# Patient Record
Sex: Female | Born: 1935 | Race: Black or African American | Hispanic: No | Marital: Married | Smoking: Former smoker
Health system: Southern US, Community
[De-identification: ages and names within clinical notes are randomized; demographics above are authoritative.]

## PROBLEM LIST (undated history)

## (undated) DIAGNOSIS — J849 Interstitial pulmonary disease, unspecified: Secondary | ICD-10-CM

## (undated) DIAGNOSIS — K219 Gastro-esophageal reflux disease without esophagitis: Secondary | ICD-10-CM

---

## 2021-01-23 ENCOUNTER — Other Ambulatory Visit: Payer: Self-pay

## 2021-01-23 ENCOUNTER — Emergency Department (HOSPITAL_COMMUNITY)
Admission: EM | Admit: 2021-01-23 | Discharge: 2021-01-23 | Disposition: A | Discharge status: Home / Self Care | Payer: Medicare Other | Attending: Emergency Medicine | Admitting: Emergency Medicine

## 2021-01-23 ENCOUNTER — Emergency Department (HOSPITAL_COMMUNITY): Payer: Medicare Other

## 2021-01-23 ENCOUNTER — Encounter (HOSPITAL_COMMUNITY): Payer: Self-pay | Admitting: *Deleted

## 2021-01-23 DIAGNOSIS — Z20822 Contact with and (suspected) exposure to covid-19: Secondary | ICD-10-CM | POA: Insufficient documentation

## 2021-01-23 DIAGNOSIS — R0602 Shortness of breath: Secondary | ICD-10-CM | POA: Diagnosis present

## 2021-01-23 DIAGNOSIS — Z7982 Long term (current) use of aspirin: Secondary | ICD-10-CM | POA: Insufficient documentation

## 2021-01-23 DIAGNOSIS — J8410 Pulmonary fibrosis, unspecified: Secondary | ICD-10-CM | POA: Diagnosis not present

## 2021-01-23 DIAGNOSIS — Z87891 Personal history of nicotine dependence: Secondary | ICD-10-CM | POA: Insufficient documentation

## 2021-01-23 HISTORY — DX: Gastro-esophageal reflux disease without esophagitis: K21.9

## 2021-01-23 HISTORY — DX: Interstitial pulmonary disease, unspecified: J84.9

## 2021-01-23 LAB — COMPREHENSIVE METABOLIC PANEL
ALT: 13 U/L (ref 0–44)
AST: 21 U/L (ref 15–41)
Albumin: 3.5 g/dL (ref 3.5–5.0)
Alkaline Phosphatase: 57 U/L (ref 38–126)
Anion gap: 7 (ref 5–15)
BUN: 14 mg/dL (ref 8–23)
CO2: 37 mmol/L — ABNORMAL HIGH (ref 22–32)
Calcium: 9 mg/dL (ref 8.9–10.3)
Chloride: 84 mmol/L — ABNORMAL LOW (ref 98–111)
Creatinine, Ser: 0.73 mg/dL (ref 0.44–1.00)
GFR, Estimated: >60 mL/min (ref >60)
Glucose, Bld: 106 mg/dL — ABNORMAL HIGH (ref 70–99)
Potassium: 4.3 mmol/L (ref 3.5–5.1)
Sodium: 128 mmol/L — ABNORMAL LOW (ref 135–145)
Total Bilirubin: 0.5 mg/dL (ref 0.3–1.2)
Total Protein: 7.6 g/dL (ref 6.5–8.1)

## 2021-01-23 LAB — CBC WITH DIFFERENTIAL/PLATELET
Abs Immature Granulocytes: 0.02 10*3/uL (ref 0.00–0.07)
Basophils Absolute: 0 10*3/uL (ref 0.0–0.1)
Basophils Relative: 0 %
Eosinophils Absolute: 0.1 10*3/uL (ref 0.0–0.5)
Eosinophils Relative: 1 %
HCT: 43.8 % (ref 36.0–46.0)
Hemoglobin: 14.2 g/dL (ref 12.0–15.0)
Immature Granulocytes: 0 %
Lymphocytes Relative: 16 %
Lymphs Abs: 1 10*3/uL (ref 0.7–4.0)
MCH: 30.7 pg (ref 26.0–34.0)
MCHC: 32.4 g/dL (ref 30.0–36.0)
MCV: 94.8 fL (ref 80.0–100.0)
Monocytes Absolute: 0.4 10*3/uL (ref 0.1–1.0)
Monocytes Relative: 6 %
Neutro Abs: 4.7 10*3/uL (ref 1.7–7.7)
Neutrophils Relative %: 77 %
Platelets: 157 10*3/uL (ref 150–400)
RBC: 4.62 MIL/uL (ref 3.87–5.11)
RDW: 12.4 % (ref 11.5–15.5)
WBC: 6.1 10*3/uL (ref 4.0–10.5)
nRBC: 0 % (ref 0.0–0.2)

## 2021-01-23 LAB — URINALYSIS, ROUTINE W REFLEX MICROSCOPIC
Bacteria, UA: NONE SEEN
Bilirubin Urine: NEGATIVE
Glucose, UA: NEGATIVE mg/dL
Ketones, ur: NEGATIVE mg/dL
Leukocytes,Ua: NEGATIVE
Nitrite: NEGATIVE
Protein, ur: 30 mg/dL — AB
RBC / HPF: >50 RBC/hpf — ABNORMAL HIGH (ref 0–5)
Specific Gravity, Urine: 1.013 (ref 1.005–1.030)
pH: 6 (ref 5.0–8.0)

## 2021-01-23 LAB — RESP PANEL BY RT-PCR (FLU A&B, COVID) ARPGX2
Influenza A by PCR: NEGATIVE
Influenza B by PCR: NEGATIVE
SARS Coronavirus 2 by RT PCR: NEGATIVE

## 2021-01-23 LAB — BRAIN NATRIURETIC PEPTIDE: B Natriuretic Peptide: 423 pg/mL — ABNORMAL HIGH (ref 0.0–100.0)

## 2021-01-23 MED ORDER — METHYLPREDNISOLONE SODIUM SUCC 125 MG IJ SOLR
125.0000 mg | Freq: Once | INTRAMUSCULAR | Status: AC
  Administered 2021-01-23: 125 mg via INTRAVENOUS
  Filled 2021-01-23: qty 2

## 2021-01-23 MED ORDER — PREDNISONE 10 MG (21) PO TBPK: ORAL_TABLET | Freq: Every day | ORAL | 0 refills | Status: AC

## 2021-01-23 NOTE — Discharge Instructions (Addendum)
Do not take the prednisone 10 mg while taking the prednisone taper.  You can resume taking the 10 mg once you are done with your taper.

## 2021-01-23 NOTE — ED Notes (Signed)
This nurse attempted to ambulate patient, family at bedside stated that she is unable to walk.

## 2021-01-23 NOTE — ED Triage Notes (Signed)
C/o shortness of breath, sent by home health nurse

## 2021-01-23 NOTE — ED Provider Notes (Signed)
[facility] EMERGENCY DEPARTMENT Provider Note   CSN: [id number] Arrival date & time: 01/23/21  1719     History Chief Complaint  Patient presents with   Shortness of Breath    Denise Bates is a 86 y.o. female.  Pt presents to the ED today with sob.  Pt has a hx of pulmonary hypertension and pulmonary fibrosis.  She is on 4 L oxygen all the time.  She has a home health nurse that comes out and said her O2 sat would drop into the mid-80s when she moved around.  She also feels like her stomach is "raw."  No fevers.  She has been vaccinated against Covid, but no booster.      Past Medical History:  Diagnosis Date   Acid reflux disease    Interstitial lung disease (HCC)     There are no problems to display for this patient.   History reviewed. No pertinent surgical history.   OB History   No obstetric history on file.     No family history on file.  Social History   Tobacco Use   Smoking status: Former    Types: Cigarettes   Smokeless tobacco: Never  Substance Use Topics   Alcohol use: Not Currently    Home Medications Prior to Admission medications   Medication Sig Start Date End Date Taking? Authorizing Provider  aspirin EC 81 MG tablet Take 81 mg by mouth once a week. Swallow whole.   Yes [provider]  carvedilol (COREG) 6.25 MG tablet Take 6.25 mg by mouth 2 (two) times daily. 01/03/21  Yes [provider]  esomeprazole (NEXIUM) 40 MG capsule Take 40 mg by mouth daily. 01/03/21  Yes [provider]  famotidine (PEPCID) 20 MG tablet Take 20 mg by mouth 2 (two) times daily. 10/21/20  Yes [provider]  predniSONE (DELTASONE) 10 MG tablet Take 10 mg by mouth daily with breakfast.   Yes [provider]  predniSONE (STERAPRED UNI-PAK 21 TAB) 10 MG (21) TBPK tablet Take by mouth daily. Take 6 tabs by mouth daily  for 2 days, then 5 tabs for 2 days, then 4 tabs for 2 days, then 3 tabs for 2 days, 2 tabs for 2 days,  then 1 tab by mouth daily for 2 days 01/23/21  Yes Jacalyn Lefevre, MD  vitamin B-12 (CYANOCOBALAMIN) 500 MCG tablet Take 500 mcg by mouth daily.   Yes [provider]    Allergies    Patient has no known allergies.  Review of Systems   Review of Systems  Respiratory:  Positive for shortness of breath.   Gastrointestinal:  Positive for nausea.  All other systems reviewed and are negative.  Physical Exam Updated Vital Signs BP 119/66   Pulse 92   Temp 98.7 F (37.1 C) (Oral)   Resp (!) 35   SpO2 93%   Physical Exam Vitals and nursing note reviewed.  Constitutional:      Appearance: She is well-developed.  HENT:     Head: Normocephalic and atraumatic.     Mouth/Throat:     Mouth: Mucous membranes are moist.     Pharynx: Oropharynx is clear.  Eyes:     Extraocular Movements: Extraocular movements intact.     Pupils: Pupils are equal, round, and reactive to light.  Cardiovascular:     Rate and Rhythm: Normal rate and regular rhythm.  Pulmonary:     Effort: Tachypnea present.     Breath sounds: Rhonchi present.  Abdominal:     General: Bowel sounds are normal.     Palpations: Abdomen is soft.  Musculoskeletal:        General: Normal range of motion.     Cervical back: Normal range of motion and neck supple.  Skin:    General: Skin is warm.     Capillary Refill: Capillary refill takes less than 2 seconds.  Neurological:     General: No focal deficit present.     Mental Status: She is alert and oriented to person, place, and time.  Psychiatric:        Mood and Affect: Mood normal.        Behavior: Behavior normal.    ED Results / Procedures / Treatments   Labs (all labs ordered are listed, but only abnormal results are displayed) Labs Reviewed  BRAIN NATRIURETIC PEPTIDE - Abnormal; Notable for the following components:      Result Value   B Natriuretic Peptide 423.0 (*)    All other components within normal limits  COMPREHENSIVE METABOLIC PANEL -  Abnormal; Notable for the following components:   Sodium 128 (*)    Chloride 84 (*)    CO2 37 (*)    Glucose, Bld 106 (*)    All other components within normal limits  URINALYSIS, ROUTINE W REFLEX MICROSCOPIC - Abnormal; Notable for the following components:   Color, Urine AMBER (*)    Hgb urine dipstick LARGE (*)    Protein, ur 30 (*)    RBC / HPF >50 (*)    All other components within normal limits  RESP PANEL BY RT-PCR (FLU A&B, COVID) ARPGX2  CBC WITH DIFFERENTIAL/PLATELET    EKG EKG Interpretation  Date/Time:  Thursday January 23 2021 19:00:43 EDT Ventricular Rate:  89 PR Interval:  146 QRS Duration: 100 QT Interval:  377 QTC Calculation: 459 R Axis:   1 Text Interpretation: Sinus arrhythmia Probable left atrial enlargement Borderline T abnormalities, inferior leads No old tracing to compare Confirmed by Jacalyn Lefevre [phone]) on 01/23/2021 7:05:57 PM  Radiology DG Chest 2 View  Result Date: 01/23/2021 CLINICAL DATA:  Short of breath, history of interstitial lung disease EXAM: CHEST - 2 VIEW COMPARISON:  None. FINDINGS: Frontal and lateral views of the chest demonstrate an unremarkable cardiac silhouette. There is extensive parenchymal lung scarring and fibrosis. No superimposed airspace disease, effusion, or pneumothorax. No acute bony abnormalities. IMPRESSION: 1. Extensive pulmonary fibrosis.  No acute airspace disease. Electronically Signed   By: Sharlet Salina M.D.   On: 01/23/2021 19:12    Procedures Procedures   Medications Ordered in ED Medications  methylPREDNISolone sodium succinate (SOLU-MEDROL) 125 mg/2 mL injection 125 mg (125 mg Intravenous Given 01/23/21 2203)    ED Course  I have reviewed the triage vital signs and the nursing notes.  Pertinent labs & imaging results that were available during my care of the patient were reviewed by me and considered in my medical decision making (see chart for details).    MDM Rules/Calculators/A&P                            Pt has severe pulmonary fibrosis, but pt and husband seem to understand very little about her disease.  She has seen a pulmonologist for years and it looks like pt and family have been told numerous times the severity of her disease.  Pt's husband  shown pt's CXR and I compared it to a normal  lung CXR.  I tried to explain the end stage nature of the disease.  Pt was getting it, but the husband was not.  Covid negative.  Pt's O2 sat does drop with movement.  II think this is likely chronic for her.  Pt is nonambulatory at home.  She is given a dose of solumedrol here and is d/c with a prednisone taper.  She is stable for d/c.   Final Clinical Impression(s) / ED Diagnoses Final diagnoses:  Pulmonary fibrosis (HCC)    Rx / DC Orders ED Discharge Orders          Ordered    predniSONE (STERAPRED UNI-PAK 21 TAB) 10 MG (21) TBPK tablet  Daily        01/23/21 2214             Jacalyn Lefevre, MD 01/23/21 2216

## 2021-10-04 DEATH — deceased

## 2022-01-01 IMAGING — DX DG CHEST 2V
2 series · 2 of 2 positions shown · non-contrast
Comparison: None.

CLINICAL DATA: Short of breath, history of interstitial lung
disease

EXAM:
CHEST - 2 VIEW

[chest ap]
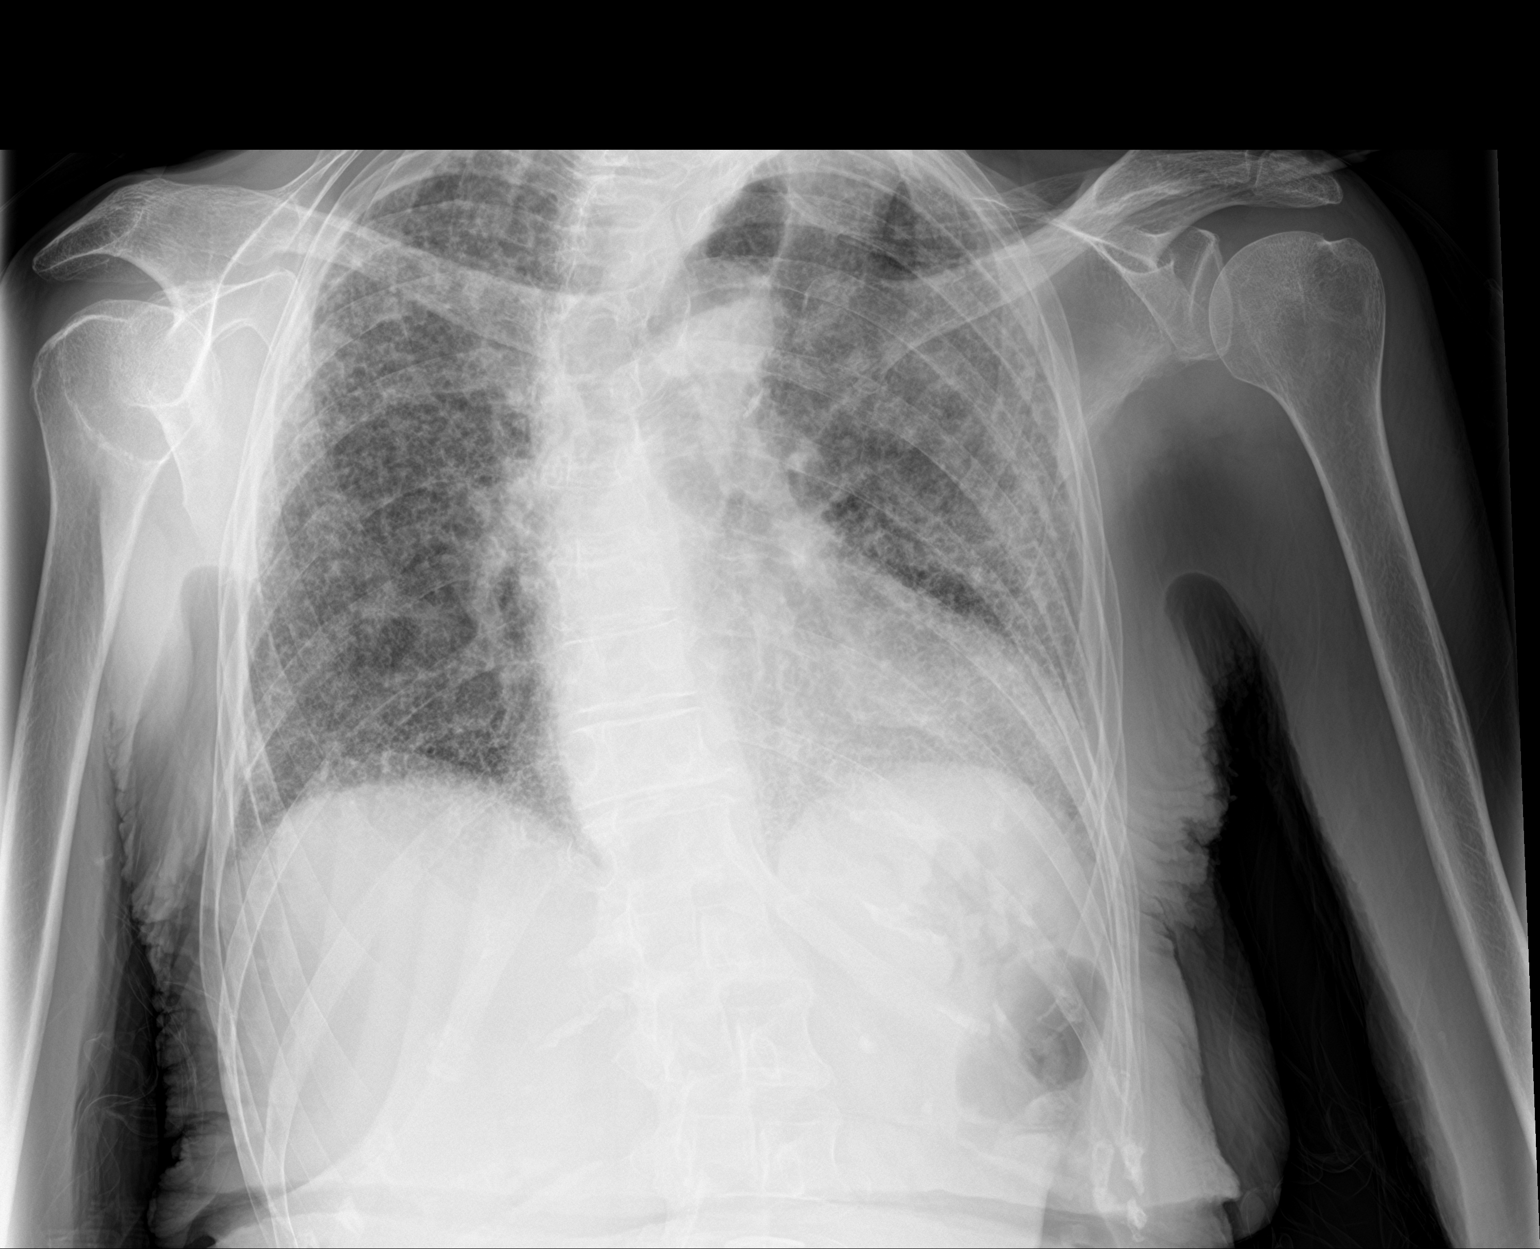

[chest lat]
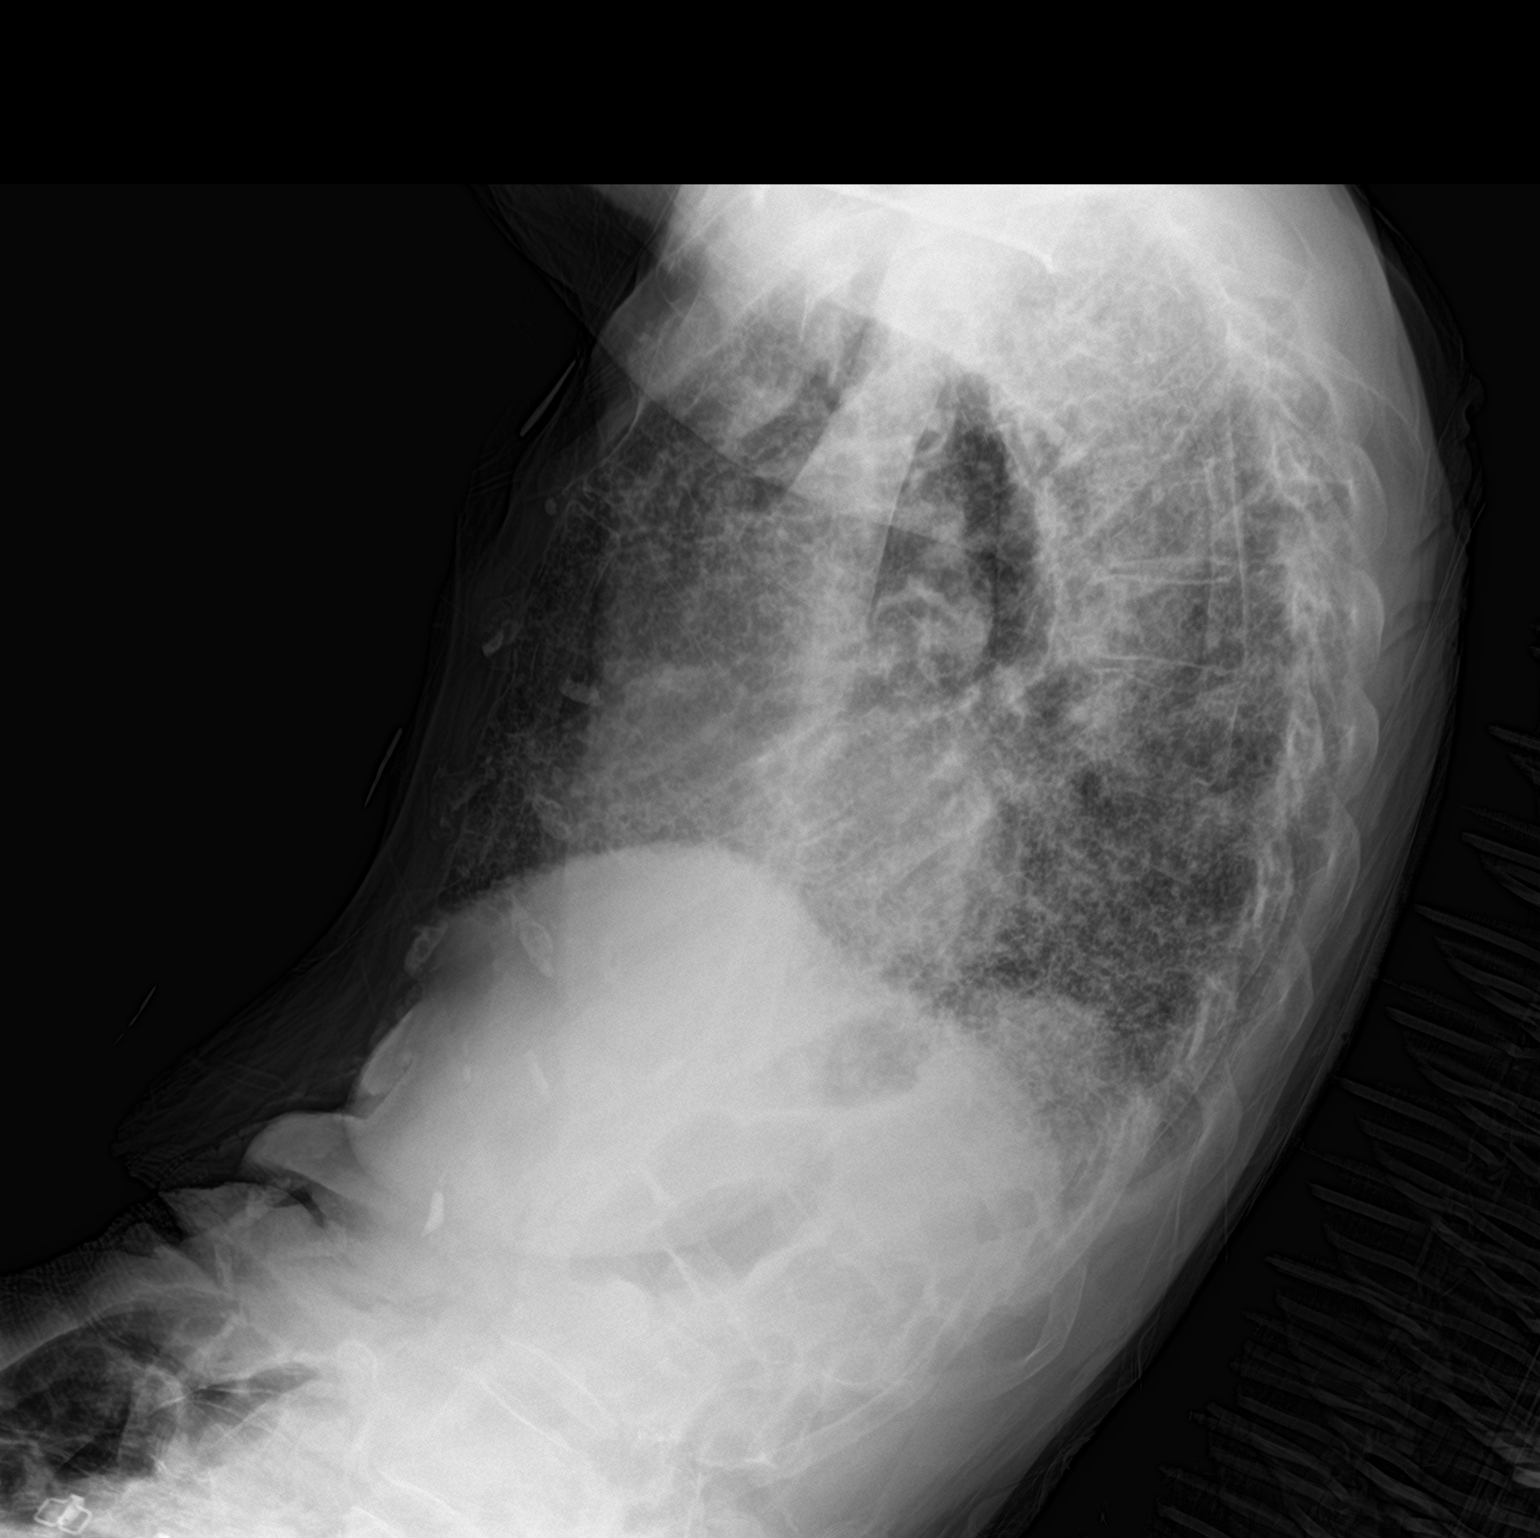

[2 of 2 positions shown; findings below may reference images not displayed]

FINDINGS: Frontal and lateral views of the chest demonstrate an unremarkable
cardiac silhouette. There is extensive parenchymal lung scarring and
fibrosis. No superimposed airspace disease, effusion, or
pneumothorax. No acute bony abnormalities.
IMPRESSION: 1. Extensive pulmonary fibrosis.  No acute airspace disease.
